# Patient Record
Sex: Female | Born: 2005 | Race: White | Hispanic: Yes | Marital: Single | Smoking: Never smoker
Health system: Southern US, Community
[De-identification: ages and names within clinical notes are randomized; demographics above are authoritative.]

## PROBLEM LIST (undated history)

## (undated) DIAGNOSIS — F329 Major depressive disorder, single episode, unspecified: Secondary | ICD-10-CM

## (undated) DIAGNOSIS — J309 Allergic rhinitis, unspecified: Secondary | ICD-10-CM

## (undated) DIAGNOSIS — Z9149 Other personal history of psychological trauma, not elsewhere classified: Principal | ICD-10-CM

## (undated) DIAGNOSIS — F909 Attention-deficit hyperactivity disorder, unspecified type: Secondary | ICD-10-CM

## (undated) DIAGNOSIS — Z6221 Child in welfare custody: Secondary | ICD-10-CM

## (undated) HISTORY — DX: Other personal history of psychological trauma, not elsewhere classified: Z91.49

## (undated) HISTORY — DX: Child in welfare custody: Z62.21

## (undated) HISTORY — DX: Major depressive disorder, single episode, unspecified: F32.9

## (undated) HISTORY — DX: Attention-deficit hyperactivity disorder, unspecified type: F90.9

## (undated) HISTORY — DX: Allergic rhinitis, unspecified: J30.9

---
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
## 2023-01-03 DIAGNOSIS — Z419 Encounter for procedure for purposes other than remedying health state, unspecified: Secondary | ICD-10-CM | POA: Diagnosis not present

## 2023-02-01 DIAGNOSIS — Z419 Encounter for procedure for purposes other than remedying health state, unspecified: Secondary | ICD-10-CM | POA: Diagnosis not present

## 2023-03-04 DIAGNOSIS — Z419 Encounter for procedure for purposes other than remedying health state, unspecified: Secondary | ICD-10-CM | POA: Diagnosis not present

## 2023-03-27 ENCOUNTER — Encounter: Payer: Self-pay | Admitting: Emergency Medicine

## 2023-03-27 ENCOUNTER — Ambulatory Visit
Admission: EM | Admit: 2023-03-27 | Discharge: 2023-03-27 | Disposition: A | Discharge status: Home / Self Care | Payer: Medicaid Other | Attending: Family Medicine | Admitting: Family Medicine

## 2023-03-27 DIAGNOSIS — J302 Other seasonal allergic rhinitis: Secondary | ICD-10-CM

## 2023-03-27 DIAGNOSIS — J029 Acute pharyngitis, unspecified: Secondary | ICD-10-CM

## 2023-03-27 LAB — POCT RAPID STREP A (OFFICE): Rapid Strep A Screen: NEGATIVE

## 2023-03-27 MED ORDER — FLUTICASONE PROPIONATE 50 MCG/ACT NA SUSP: 1.0000 | Freq: Every day | NASAL | 2 refills | Status: AC

## 2023-03-27 MED ORDER — CETIRIZINE HCL 10 MG PO TABS: 10.0000 mg | ORAL_TABLET | Freq: Every day | ORAL | 2 refills | Status: AC

## 2023-03-27 NOTE — ED Triage Notes (Signed)
Rash on both forearms that itches since Monday.  Has been using aloe vera gel on rash and states it has gotten better.  States headache and sore throat with nausea.    States she also needs a refill on her vyvanse.
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
## 2023-03-27 NOTE — Discharge Instructions (Signed)
You may use over the counter ibuprofen or acetaminophen as needed.  For a sore throat, over the counter products such as Colgate Peroxyl Mouth Sore Rinse or Chloraseptic Sore Throat Spray may provide some temporary relief. Your rapid strep test was negative today. We have sent your throat swab for culture and will let you know of any positive results. 

## 2023-03-30 LAB — CULTURE, GROUP A STREP (THRC)

## 2023-04-03 DIAGNOSIS — Z419 Encounter for procedure for purposes other than remedying health state, unspecified: Secondary | ICD-10-CM | POA: Diagnosis not present

## 2023-05-04 DIAGNOSIS — Z419 Encounter for procedure for purposes other than remedying health state, unspecified: Secondary | ICD-10-CM | POA: Diagnosis not present

## 2023-06-03 DIAGNOSIS — Z419 Encounter for procedure for purposes other than remedying health state, unspecified: Secondary | ICD-10-CM | POA: Diagnosis not present

## 2023-07-04 DIAGNOSIS — Z419 Encounter for procedure for purposes other than remedying health state, unspecified: Secondary | ICD-10-CM | POA: Diagnosis not present

## 2023-08-04 DIAGNOSIS — Z419 Encounter for procedure for purposes other than remedying health state, unspecified: Secondary | ICD-10-CM | POA: Diagnosis not present

## 2023-09-03 DIAGNOSIS — Z419 Encounter for procedure for purposes other than remedying health state, unspecified: Secondary | ICD-10-CM | POA: Diagnosis not present

## 2023-10-01 ENCOUNTER — Emergency Department (HOSPITAL_COMMUNITY)
Admission: EM | Admit: 2023-10-01 | Discharge: 2023-10-02 | Discharge status: Against Medical Advice | Payer: Medicaid Other | Attending: Emergency Medicine | Admitting: Emergency Medicine

## 2023-10-01 ENCOUNTER — Other Ambulatory Visit: Payer: Self-pay

## 2023-10-01 DIAGNOSIS — Z5321 Procedure and treatment not carried out due to patient leaving prior to being seen by health care provider: Secondary | ICD-10-CM | POA: Insufficient documentation

## 2023-10-01 DIAGNOSIS — M549 Dorsalgia, unspecified: Secondary | ICD-10-CM | POA: Diagnosis not present
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
## 2023-10-04 DIAGNOSIS — Z419 Encounter for procedure for purposes other than remedying health state, unspecified: Secondary | ICD-10-CM | POA: Diagnosis not present

## 2023-10-04 DIAGNOSIS — N898 Other specified noninflammatory disorders of vagina: Secondary | ICD-10-CM | POA: Diagnosis not present

## 2023-10-04 DIAGNOSIS — R300 Dysuria: Secondary | ICD-10-CM | POA: Diagnosis not present

## 2023-10-04 DIAGNOSIS — N390 Urinary tract infection, site not specified: Secondary | ICD-10-CM | POA: Diagnosis not present

## 2023-10-04 DIAGNOSIS — Z7251 High risk heterosexual behavior: Secondary | ICD-10-CM | POA: Diagnosis not present

## 2023-10-24 ENCOUNTER — Encounter: Payer: Medicaid Other | Admitting: Adult Health

## 2024-04-13 ENCOUNTER — Emergency Department (HOSPITAL_COMMUNITY)
Admission: EM | Admit: 2024-04-13 | Discharge: 2024-04-14 | Disposition: A | Discharge status: Home / Self Care | Payer: Self-pay | Attending: Emergency Medicine | Admitting: Emergency Medicine

## 2024-04-13 ENCOUNTER — Other Ambulatory Visit: Payer: Self-pay

## 2024-04-13 DIAGNOSIS — Y92410 Unspecified street and highway as the place of occurrence of the external cause: Secondary | ICD-10-CM | POA: Diagnosis not present

## 2024-04-13 DIAGNOSIS — R1030 Lower abdominal pain, unspecified: Secondary | ICD-10-CM | POA: Insufficient documentation

## 2024-04-13 DIAGNOSIS — F10920 Alcohol use, unspecified with intoxication, uncomplicated: Secondary | ICD-10-CM | POA: Diagnosis not present

## 2024-04-13 DIAGNOSIS — E876 Hypokalemia: Secondary | ICD-10-CM | POA: Insufficient documentation

## 2024-04-13 DIAGNOSIS — Y908 Blood alcohol level of 240 mg/100 ml or more: Secondary | ICD-10-CM | POA: Diagnosis not present

## 2024-04-13 MED ORDER — ACETAMINOPHEN 325 MG PO TABS
650.0000 mg | ORAL_TABLET | Freq: Once | ORAL | Status: AC
  Administered 2024-04-14: 650 mg via ORAL
  Filled 2024-04-13: qty 2
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
## 2024-04-14 ENCOUNTER — Emergency Department (HOSPITAL_COMMUNITY): Payer: Self-pay

## 2024-04-14 LAB — CBC
HCT: 41.8 % (ref 36.0–46.0)
Hemoglobin: 14.1 g/dL (ref 12.0–15.0)
MCH: 30.3 pg (ref 26.0–34.0)
MCHC: 33.7 g/dL (ref 30.0–36.0)
MCV: 89.9 fL (ref 80.0–100.0)
Platelets: 370 10*3/uL (ref 150–400)
RBC: 4.65 MIL/uL (ref 3.87–5.11)
RDW: 12.8 % (ref 11.5–15.5)
WBC: 8.1 10*3/uL (ref 4.0–10.5)
nRBC: 0 % (ref 0.0–0.2)

## 2024-04-14 LAB — COMPREHENSIVE METABOLIC PANEL WITH GFR
ALT: 82 U/L — ABNORMAL HIGH (ref 0–44)
AST: 44 U/L — ABNORMAL HIGH (ref 15–41)
Albumin: 4.7 g/dL (ref 3.5–5.0)
Alkaline Phosphatase: 75 U/L (ref 38–126)
Anion gap: 9 (ref 5–15)
BUN: 7 mg/dL (ref 6–20)
CO2: 23 mmol/L (ref 22–32)
Calcium: 9.3 mg/dL (ref 8.9–10.3)
Chloride: 110 mmol/L (ref 98–111)
Creatinine, Ser: 0.59 mg/dL (ref 0.44–1.00)
GFR, Estimated: >60 mL/min (ref >60)
Glucose, Bld: 118 mg/dL — ABNORMAL HIGH (ref 70–99)
Potassium: 2.9 mmol/L — ABNORMAL LOW (ref 3.5–5.1)
Sodium: 142 mmol/L (ref 135–145)
Total Bilirubin: 0.6 mg/dL (ref 0.0–1.2)
Total Protein: 8.4 g/dL — ABNORMAL HIGH (ref 6.5–8.1)

## 2024-04-14 LAB — POC URINE PREG, ED: Preg Test, Ur: NEGATIVE

## 2024-04-14 LAB — ETHANOL: Alcohol, Ethyl (B): 254 mg/dL — ABNORMAL HIGH (ref <15)

## 2024-04-14 LAB — MAGNESIUM: Magnesium: 2.1 mg/dL (ref 1.7–2.4)

## 2024-04-14 MED ORDER — IOHEXOL 300 MG/ML  SOLN
100.0000 mL | Freq: Once | INTRAMUSCULAR | Status: AC | PRN
  Administered 2024-04-14: 100 mL via INTRAVENOUS

## 2024-04-14 MED ORDER — POTASSIUM CHLORIDE 10 MEQ/100ML IV SOLN
10.0000 meq | Freq: Once | INTRAVENOUS | Status: AC
  Administered 2024-04-14: 10 meq via INTRAVENOUS
  Filled 2024-04-14: qty 100

## 2024-04-14 MED ORDER — SODIUM CHLORIDE 0.9 % IV BOLUS
1000.0000 mL | Freq: Once | INTRAVENOUS | Status: AC
  Administered 2024-04-14: 1000 mL via INTRAVENOUS

## 2024-04-14 MED ORDER — POTASSIUM CHLORIDE CRYS ER 20 MEQ PO TBCR
40.0000 meq | EXTENDED_RELEASE_TABLET | Freq: Once | ORAL | Status: AC
Start: 2024-04-14 — End: 2024-04-14
  Administered 2024-04-14: 40 meq via ORAL
  Filled 2024-04-14: qty 2

## 2024-04-14 NOTE — ED Notes (Signed)
 Updated pt sister on situation regarding pt. Pt verbalized that it was ok to give sister her personal information.

## 2024-04-14 NOTE — Discharge Instructions (Addendum)
 Your potassium was low.  You received potassium replacement while in the ER.  Your test results were otherwise normal.  Take ibuprofen and Tylenol as needed for pain and soreness.

## 2024-06-13 NOTE — ED Provider Notes (Signed)
 ------------------------------------------------------------------------------- Attestation signed by Rosamond Lyndy Beagle, MD at 06/13/24 [phone] For this patient visit, I have reviewed the APC's documentation, orders, treatment plan, and medical decision making. I agree with the contents.  -------------------------------------------------------------------------------                                                                                     Emergency Department Provider Note    ED Clinical Impression   Final diagnoses:  Acute midline low back pain without sciatica (Primary)    ED Assessment/Plan    Condition: Stable Disposition: Discharge  This chart has been completed using Dragon Medical Dictation software, and while attempts have been made to ensure accuracy, certain words and phrases may not be transcribed as intended.   History   Chief Complaint  Patient presents with  . Back Pain   HPI  Carly Ruiz is a 18 y.o. female  who presents today to the  emergency department complaining of low back pain x 5 days.   States pain in midline, sharp, moves from sacrum to lumbar region, worse with movement/walking.   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   Denies fever, injury, rash, abd pain, urinary symptoms, numbness, gait difficulty, incontinence.  No distress.  No medical history.  Denies IVDA.      Allergies: has no known allergies. Medications: is not on any long-term medications. PMHx:  has no past medical history on file. PSHx:  has no past surgical history on file. SocHx:  reports that she has quit smoking. She has never used smokeless tobacco. She reports that she does not drink alcohol and does not use drugs. Allergies, Medications, Medical, Surgical, and Social History were reviewed as documented above.   Social Drivers of Health with Concerns   Tobacco Use: Medium Risk (03/28/2023)   Patient History   . Smoking Tobacco Use: Former   .  Smokeless Tobacco Use: Never   . Passive Exposure: Not on file  Alcohol Use: Not on file  Physical Activity: Not on file  Stress: Stress Concern Present (06/13/2024)   [employer] of Occupational Health - Occupational Stress Questionnaire   . Feeling of Stress: To some extent  Substance Use: Not on file (10/09/2023)  Social Connections: Moderately Isolated (06/13/2024)   Social Connection and Isolation Panel   . Frequency of Communication with Friends and Family: More than three times a week   . Frequency of Social Gatherings with Friends and Family: More than three times a week   . Attends Religious Services: More than 4 times per year   . Active Member of Clubs or Organizations: No   . Attends Banker Meetings: Not on file   . Marital Status: Never married  Physicist, medical Strain: Medium Risk (06/13/2024)   Overall Financial Resource Strain (CARDIA)   . Difficulty of Paying Living Expenses: Somewhat hard  Health Literacy: Not on file     Review Of Systems  Review of Systems  Constitutional:  Negative for chills and fever.  Respiratory:  Negative for cough and shortness of breath.   Cardiovascular:  Negative for chest pain.  Gastrointestinal:  Negative for abdominal pain, diarrhea, nausea  and vomiting.  Genitourinary:  Negative for dysuria and frequency.  Musculoskeletal:  Positive for back pain. Negative for gait problem, myalgias and neck pain.  Skin:  Negative for rash and wound.  Neurological:  Negative for dizziness, weakness and headaches.    Physical Exam   BP 136/76   Pulse 68   Temp 36.6 C (97.8 F) (Oral)   Resp 20   Ht 157.5 cm (5' 2)   Wt 83 kg (183 lb)   LMP 05/14/2024   SpO2 99%   BMI 33.47 kg/m   Physical Exam Constitutional:      Appearance: Normal appearance. She is normal weight.  HENT:     Head: Normocephalic.  Eyes:     Conjunctiva/sclera: Conjunctivae normal.  Cardiovascular:     Rate and Rhythm: Normal rate and  regular rhythm.     Pulses: Normal pulses.     Heart sounds: Normal heart sounds.  Pulmonary:     Effort: Pulmonary effort is normal.     Breath sounds: Normal breath sounds.  Abdominal:     General: Abdomen is flat. Bowel sounds are normal.     Palpations: Abdomen is soft.  Musculoskeletal:        General: Tenderness (states ttp to midline lumbar region w/out abnormal physical findings) present.     Cervical back: Normal range of motion and neck supple.  Skin:    General: Skin is warm.  Neurological:     General: No focal deficit present.     Mental Status: She is alert.  Psychiatric:        Mood and Affect: Mood normal.     ED Course  Medical Decision Making    Procedures   No results found for this visit on 06/13/24 (from the past 4464 hours).   ED Results Results for orders placed or performed during the hospital encounter of 06/13/24  Basic Metabolic Panel  Result Value Ref Range   Sodium 139 135 - 145 mmol/L   Potassium 4.5 3.5 - 5.0 mmol/L   Chloride 103 98 - 107 mmol/L   CO2 27.5 21.0 - 32.0 mmol/L   Anion Gap 9 3 - 11 mmol/L   BUN 9 8 - 20 mg/dL   Creatinine 9.47 (L) 9.39 - 1.10 mg/dL   BUN/Creatinine Ratio 17    eGFR CKD-EPI (2021) Female >90 >=60 mL/min/1.34m2   Glucose 85 70 - 179 mg/dL   Calcium 9.7 8.5 - 89.8 mg/dL  Urine, Pregnancy Qualitatve  Result Value Ref Range   Pregnancy Test, Urine Negative Negative  Sedimentation rate, manual  Result Value Ref Range   Sed Rate 7 0 - 20 mm/h  CK  Result Value Ref Range   Creatine Kinase, Total 72.0 34.0 - 145.0 U/L  CBC w/ Differential  Result Value Ref Range   WBC 8.6 4.0 - 10.5 10*9/L   RBC 4.27 3.80 - 5.10 10*12/L   HGB 12.7 11.5 - 15.0 g/dL   HCT 62.8 65.9 - 55.9 %   MCV 86.9 80.0 - 98.0 fL   MCH 29.7 27.0 - 34.0 pg   MCHC 34.2 32.0 - 36.0 g/dL   RDW 87.3 88.4 - 85.4 %   MPV 9.2 7.4 - 10.4 fL   Platelet 347 140 - 415 10*9/L   Neutrophils % 55.7 %   Lymphocytes % 37.8 %   Monocytes % 4.5 %    Eosinophils % 1.3 %   Basophils % 0.5 %   Absolute Neutrophils 4.8 1.8 -  7.8 10*9/L   Absolute Lymphocytes 3.3 0.7 - 4.5 10*9/L   Absolute Monocytes 0.4 0.1 - 1.0 10*9/L   Absolute Eosinophils 0.1 0.0 - 0.4 10*9/L   Absolute Basophils 0.0 0.0 - 0.2 10*9/L   XR Lumbar Spine 2 or 3 Views Result Date: 06/13/2024 Exam:  Lumbar Spine  History:  Pain.  Technique:  2 views.  Comparison:  None.  Findings: Conventional anatomy.  No evidence of acute fracture or traumatic malalignment.  No scoliosis. Normal lordosis. No significant spondylolisthesis.  Vertebral body heights and intervertebral disc spaces maintained. No significant facet arthropathy.  Soft tissues unremarkable.    Normal radiograph series of the lumbar spine.  Signed (Electronic Signature): 06/13/2024 12:24 PM Signed By: Emelia Bame, MD   Medications Administered:  Medications  ketorolac (TORADOL) injection 30 mg (30 mg Intramuscular Given 06/13/24 1223)  methocarbamol (ROBAXIN) tablet 1,000 mg (1,000 mg Oral Given 06/13/24 1224)    Discharge Medications (Medications Prescribed during this  ED visit and Patient's Home Medications) :    Your Medication List     START taking these medications    ibuprofen 800 MG tablet Commonly known as: MOTRIN Take 1 tablet (800 mg total) by mouth every eight (8) hours as needed for pain.   methocarbamol 500 MG tablet Commonly known as: ROBAXIN Take 1 tablet (500 mg total) by mouth two (2) times a day for 10 days.          Hunter Jeoffrey Murray, [place] 06/13/24 1251

## 2024-07-24 DIAGNOSIS — Z139 Encounter for screening, unspecified: Secondary | ICD-10-CM

## 2024-07-24 LAB — GLUCOSE, POCT (MANUAL RESULT ENTRY): POC Glucose: 82 mg/dL (ref 70–99)

## 2024-07-24 NOTE — Congregational Nurse Program (Signed)
Opened new note in error
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
